# Patient Record
Sex: Male | Born: 1975 | Race: Black or African American | Hispanic: No | Marital: Single | Smoking: Current every day smoker
Health system: Southern US, Community
[De-identification: ages and names within clinical notes are randomized; demographics above are authoritative.]

## PROBLEM LIST (undated history)

## (undated) DIAGNOSIS — K219 Gastro-esophageal reflux disease without esophagitis: Secondary | ICD-10-CM

## (undated) HISTORY — PX: FRACTURE SURGERY: SHX138

---

## 2006-10-19 ENCOUNTER — Ambulatory Visit: Payer: Self-pay | Admitting: Internal Medicine

## 2008-01-26 IMAGING — CR RIGHT HAND - COMPLETE 3+ VIEW
1 series · 3 of 3 positions shown · non-contrast
Comparison: none

REASON FOR EXAM: hit hand
COMMENTS:

[Series 1: view not recorded · 0.17mm/px · 3 of 3 slices shown]
[im 1/3]
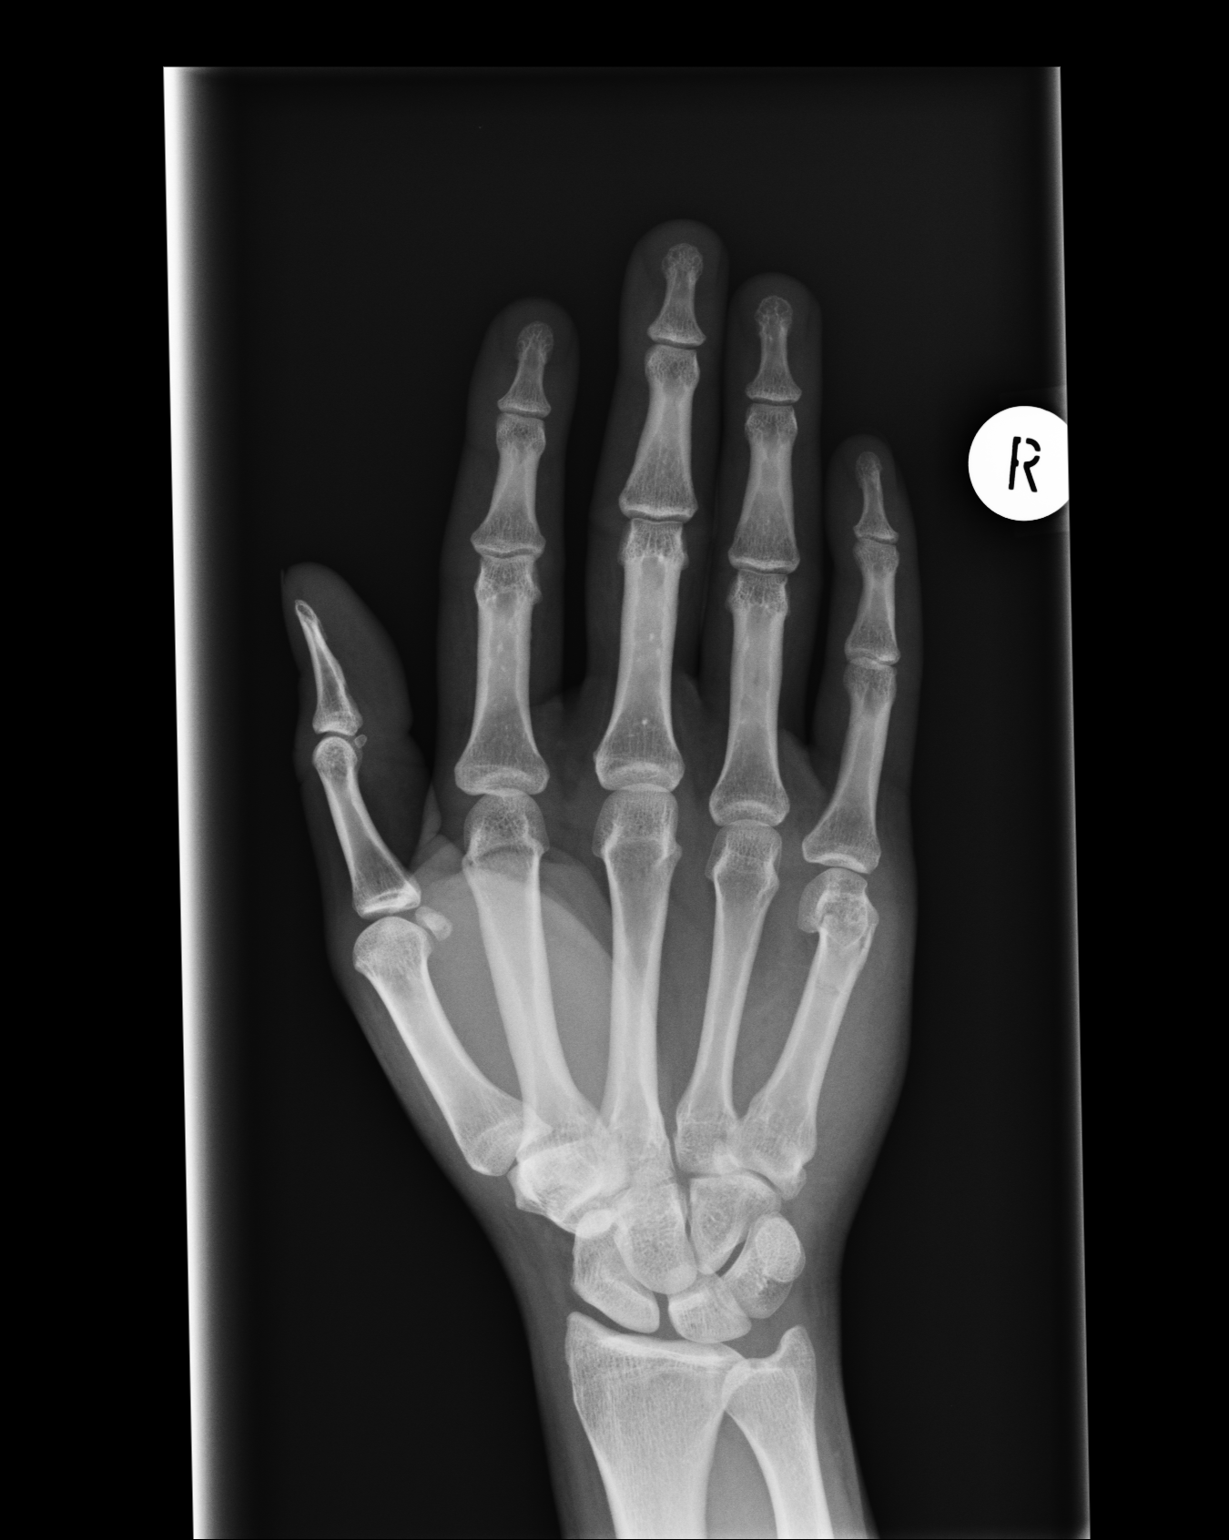
[im 2/3]
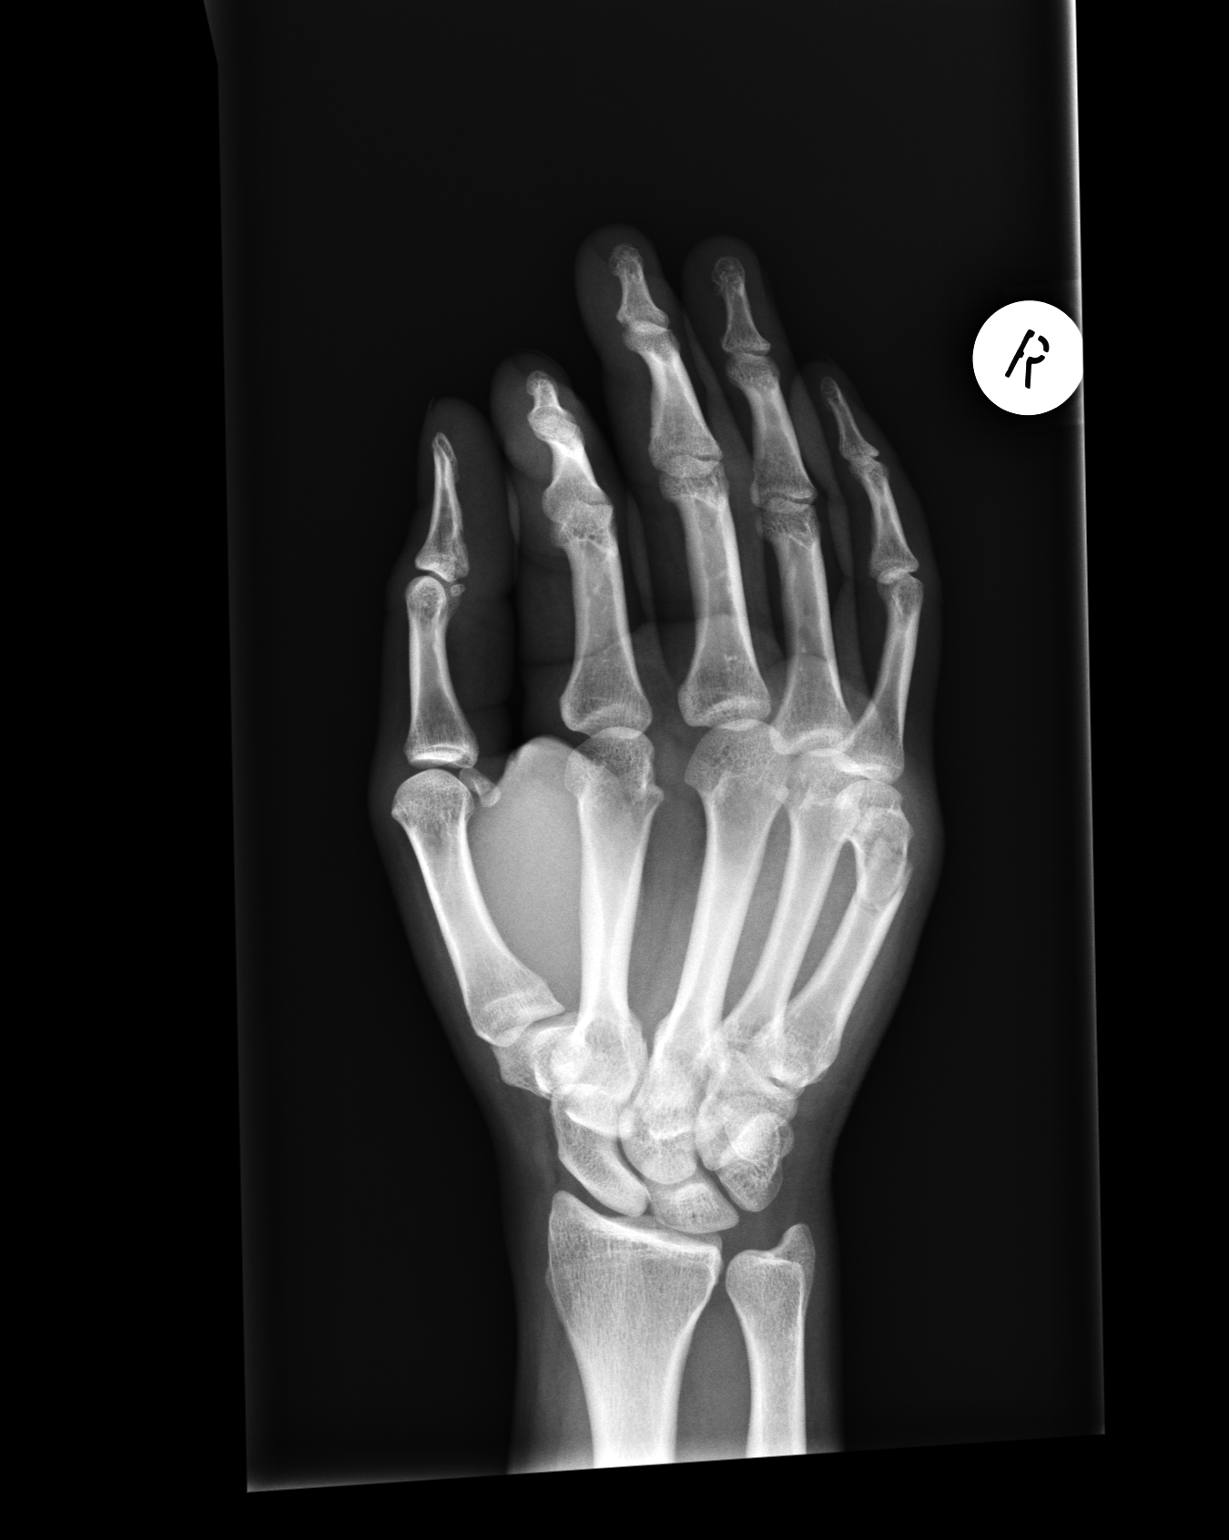
[im 3/3]
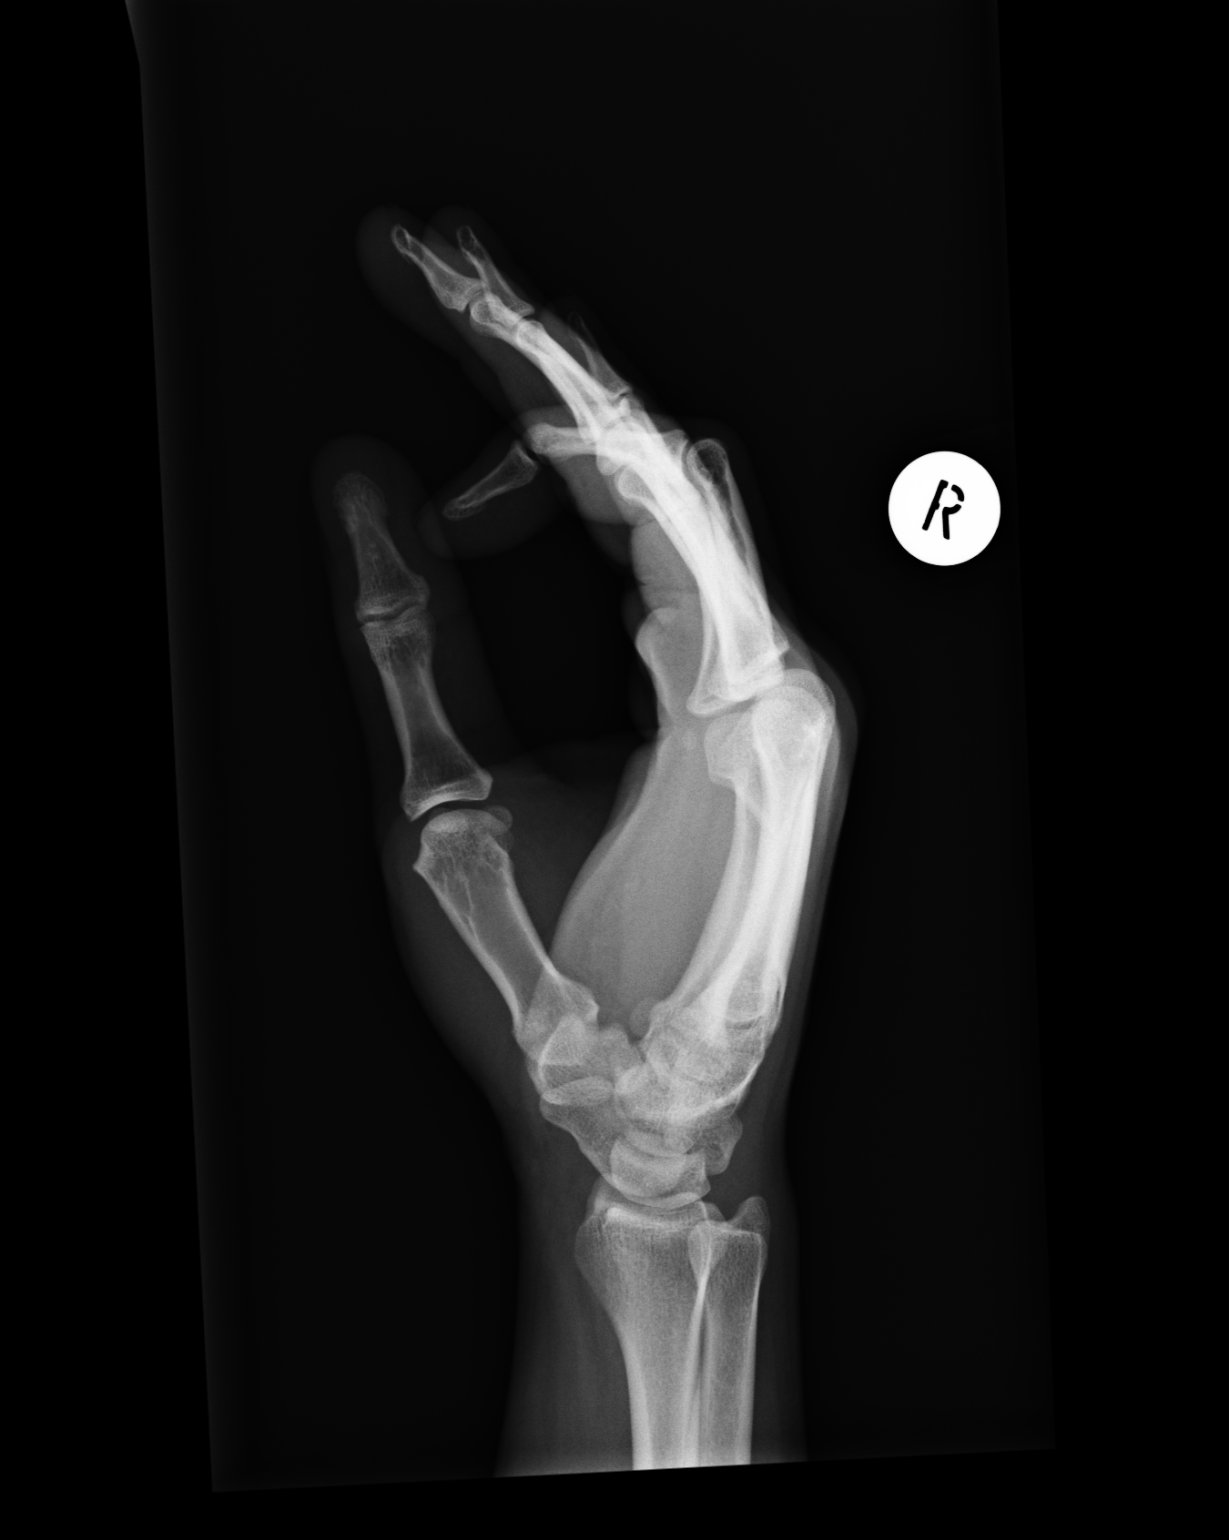

[3 of 3 positions shown; findings below may reference images not displayed]

PROCEDURE:     MDR - MDR HAND RT COMP W/OBLIQUES  - October 19, 2006  [DATE]

RESULT:     Images the right hand demonstrate a boxer's fracture in the
distal portion of the right fifth metacarpal with apex posterior angulation.
There does not appear to be a significant degree of distraction. No other
focal bony abnormality is evident.
IMPRESSION: Right fifth metacarpal fracture.

## 2018-10-25 ENCOUNTER — Other Ambulatory Visit: Payer: Self-pay

## 2018-10-25 ENCOUNTER — Ambulatory Visit (INDEPENDENT_AMBULATORY_CARE_PROVIDER_SITE_OTHER): Payer: Self-pay

## 2018-10-25 ENCOUNTER — Ambulatory Visit
Admission: EM | Admit: 2018-10-25 | Discharge: 2018-10-25 | Disposition: A | Discharge status: Home / Self Care | Payer: Self-pay

## 2018-10-25 ENCOUNTER — Encounter: Payer: Self-pay | Admitting: Emergency Medicine

## 2018-10-25 ENCOUNTER — Ambulatory Visit: Payer: Self-pay

## 2018-10-25 DIAGNOSIS — M79641 Pain in right hand: Secondary | ICD-10-CM

## 2018-10-25 DIAGNOSIS — S60221A Contusion of right hand, initial encounter: Secondary | ICD-10-CM

## 2018-10-25 DIAGNOSIS — Z8781 Personal history of (healed) traumatic fracture: Secondary | ICD-10-CM

## 2018-10-25 HISTORY — DX: Gastro-esophageal reflux disease without esophagitis: K21.9

## 2018-10-25 MED ORDER — KETOROLAC TROMETHAMINE 10 MG PO TABS: 10.0000 mg | ORAL_TABLET | Freq: Four times a day (QID) | ORAL | 0 refills | Status: AC | PRN

## 2018-10-25 NOTE — Discharge Instructions (Addendum)
It was very nice seeing you today in clinic. Thank you for entrusting me with your care.   Wear splint. REST, ICE, and ELEVATE hand to help with swelling. Please utilize the medications that we discussed. Your prescriptions have been called in to your pharmacy.   Make arrangements to follow up with orthopedic doctor. I have provided you the name and office contact information for an excellent local provider. If your symptoms/condition worsens, please seek follow up care either here or in the ER. Please remember, our [place] providers are "right here with you" when you need [place].   Again, it was my pleasure to take care of you today. Thank you for choosing our clinic. I hope that you start to feel better quickly.   Honor Loh, MSN, APRN, FNP-C, CEN Advanced [facility] Provider [facility]

## 2018-10-25 NOTE — ED Triage Notes (Signed)
Pt c/o right hand pain and swelling. He states that he slammed his hand down on a table about 2 days ago. He has full ROM on all fingers except his pinky finger.

## 2018-10-25 NOTE — ED Provider Notes (Signed)
[place], [place]   Name: Jason Hoover DOB: 03/18/1975 MRN: [id number] CSN: [id number] PCP: Patient, No Pcp Per  Arrival date and time:  10/25/18 1042  Chief Complaint:  Hand Pain (right)   NOTE: Prior to seeing the patient today, I have reviewed the triage nursing documentation and vital signs. Clinical staff has updated patient's PMH/PSHx, current medication list, and drug allergies/intolerances to ensure comprehensive history available to assist in medical decision making.   History:   HPI: Jason Hoover is a 43 y.o. male who presents today with complaints of pain and swelling to his RIGHT hand. Patient advising that he slammed his hand down on the table on Sunday, which resulted in his current symptoms. Patient is unable to make a fist due to the acute pain and swelling. He reports a history of fracture in the same hand. Patient states, "it popped out of place a while ago, but I never went back and and it put back in place because it hurt when they did it the first time". Despite his symptoms, patient has not taken any over the counter interventions to help improve/relieve his reported symptoms at home.   Past Medical History:  Diagnosis Date   GERD (gastroesophageal reflux disease)     Past Surgical History:  Procedure Laterality Date   FRACTURE SURGERY Left    foot    Family History  Problem Relation Age of Onset   Healthy Mother    Cirrhosis Father     Social History   Tobacco Use   Smoking status: Current Every Day Smoker    Packs/day: 0.50    Types: Cigarettes   Smokeless tobacco: Never Used  Substance Use Topics   Alcohol use: Not Currently   Drug use: Not Currently    There are no active problems to display for this patient.   Home Medications:    Current Meds  Medication Sig   esomeprazole (NEXIUM) 40 MG capsule Take 40 mg by mouth daily at 12 noon.    Allergies:   Patient has no known allergies.  Review of Systems (ROS): Review of  Systems  Constitutional: Negative for chills and fever.  Respiratory: Negative for cough and shortness of breath.   Cardiovascular: Negative for chest pain and palpitations.  Musculoskeletal:       Acute pain and swelling of RIGHT hand  All other systems reviewed and are negative.    Vital Signs: Today's Vitals   10/25/18 1050 10/25/18 1053  BP:  (!) 150/109  Pulse:  91  Resp:  18  Temp:  98.1 F (36.7 C)  TempSrc:  Oral  SpO2:  98%  Weight: 185 lb (83.9 kg)   Height: 5\' 9"  (1.753 m)   PainSc: 5      Physical Exam: Physical Exam  Constitutional: He is oriented to person, place, and time and well-developed, well-nourished, and in no distress.  HENT:  Head: Normocephalic and atraumatic.  Mouth/Throat: Mucous membranes are normal.  Eyes: Pupils are equal, round, and reactive to light. EOM are normal.  Neck: Neck supple.  Cardiovascular: Normal rate, regular rhythm, normal heart sounds and intact distal pulses. Exam reveals no gallop and no friction rub.  No murmur heard. Pulmonary/Chest: Effort normal and breath sounds normal. No respiratory distress. He has no wheezes. He has no rales.  Musculoskeletal:     Right hand: He exhibits decreased range of motion, tenderness (overlying 5th metacarpal) and swelling. He exhibits normal two-point discrimination, normal capillary refill and no deformity. Normal sensation  noted.  Neurological: He is alert and oriented to person, place, and time. Gait normal.  Skin: Skin is warm and dry. No rash noted.  Psychiatric: Mood, memory, affect and judgment normal.  Nursing note and vitals reviewed.   Urgent Care Treatments / Results:   LABS: PLEASE NOTE: all labs that were ordered this encounter are listed, however only abnormal results are displayed. Labs Reviewed - No data to display  EKG: -None  RADIOLOGY: Dg Hand Complete Right  Result Date: 10/25/2018 CLINICAL DATA:  Right hand pain and swelling after injury. EXAM: RIGHT HAND -  COMPLETE 3+ VIEW COMPARISON:  Radiographs of October 19, 2006. FINDINGS: Deformity of distal fifth metacarpal is noted consistent with old healed fracture. No acute fracture or dislocation is noted. Joint spaces are intact. Dorsal soft tissue swelling is noted which may be due to injury. IMPRESSION: Old distal fifth metacarpal fracture is noted. No acute abnormality is noted. Electronically Signed   By: Lupita Raider M.D.   On: 10/25/2018 11:11    PROCEDURES: Procedures  MEDICATIONS RECEIVED THIS VISIT: Medications - No data to display  PERTINENT CLINICAL COURSE NOTES/UPDATES:   Initial Impression / Assessment and Plan / Urgent Care Course:  Pertinent labs & imaging results that were available during my care of the patient were personally reviewed by me and considered in my medical decision making (see lab/imaging section of note for values and interpretations).  Bader Lello is a 43 y.o. male who presents to [facility] today with complaints of Hand Pain (right)   Patient is well appearing overall in clinic today. He does not appear to be in any acute distress. Presenting symptoms (see HPI) and exam as documented above. Diagnostic radiograph of the RIGHT hand reveals no acute abnormality. There is (+) dorsal soft tissue swelling observed. Mention made of an old distal 5th metacarpal fracture. Discussed with attending on duty Adriana Simas, DO). Recommendations were for immobilization in an ulnar gutter OCL and referral to orthopedics. Splint applied by clinic nursing staff. Encouraged patient to wear splint until evaluated by orthopedics. Patient to rest, ice, and elevate hand to help with pain and swelling. Pain is minimal until hand is being used. Will provide prescription for NSAID (ketorolac) to be used on a PRN basis for pain.   Patient needs to be seen for further evaluation by orthopedics. Name and office contact information provided on today's AVS for Dr. Kennedy Bucker. Patient advised  the he will need to contact the office to schedule an appointment to be seen. I have reviewed the follow up and strict return precautions for any new or worsening symptoms. Patient is aware of symptoms that would be deemed urgent/emergent, and would thus require further evaluation either here or in the emergency department. At the time of discharge, he verbalized understanding and consent with the discharge plan as it was reviewed with him. All questions were fielded by provider and/or clinic staff prior to patient discharge.    Final Clinical Impressions / Urgent Care Diagnoses:   Final diagnoses:  Right hand pain  Contusion of right hand, initial encounter  H/O hand fracture    New Prescriptions:  [place] Controlled Substance Registry consulted? Not Applicable  Meds ordered this encounter  Medications   ketorolac (TORADOL) 10 MG tablet    Sig: Take 1 tablet (10 mg total) by mouth every 6 (six) hours as needed.    Dispense:  20 tablet    Refill:  0    Recommended Follow up  Care:  Patient encouraged to follow up with the following provider within the specified time frame, or sooner as dictated by the severity of his symptoms. As always, he was instructed that for any urgent/emergent care needs, he should seek care either here or in the emergency department for more immediate evaluation.  Follow-up Information    Call  Hessie Knows, MD.   Specialty: Orthopedic Surgery Why: Will need to be seen in follow up Contact information: [place] [id number] [phone]         NOTE: This note was prepared using Dragon dictation software along with smaller phrase technology. Despite my best ability to proofread, there is the potential that transcriptional errors may still occur from this process, and are completely unintentional.    Karen Kitchens, NP 10/25/18 1252

## 2020-02-01 IMAGING — CR DG HAND COMPLETE 3+V*R*
3 series · 3 of 3 positions shown · non-contrast
Comparison: Radiographs October 19, 2006.

CLINICAL DATA: Right hand pain and swelling after injury.

EXAM:
RIGHT HAND - COMPLETE 3+ VIEW

[hand ap]
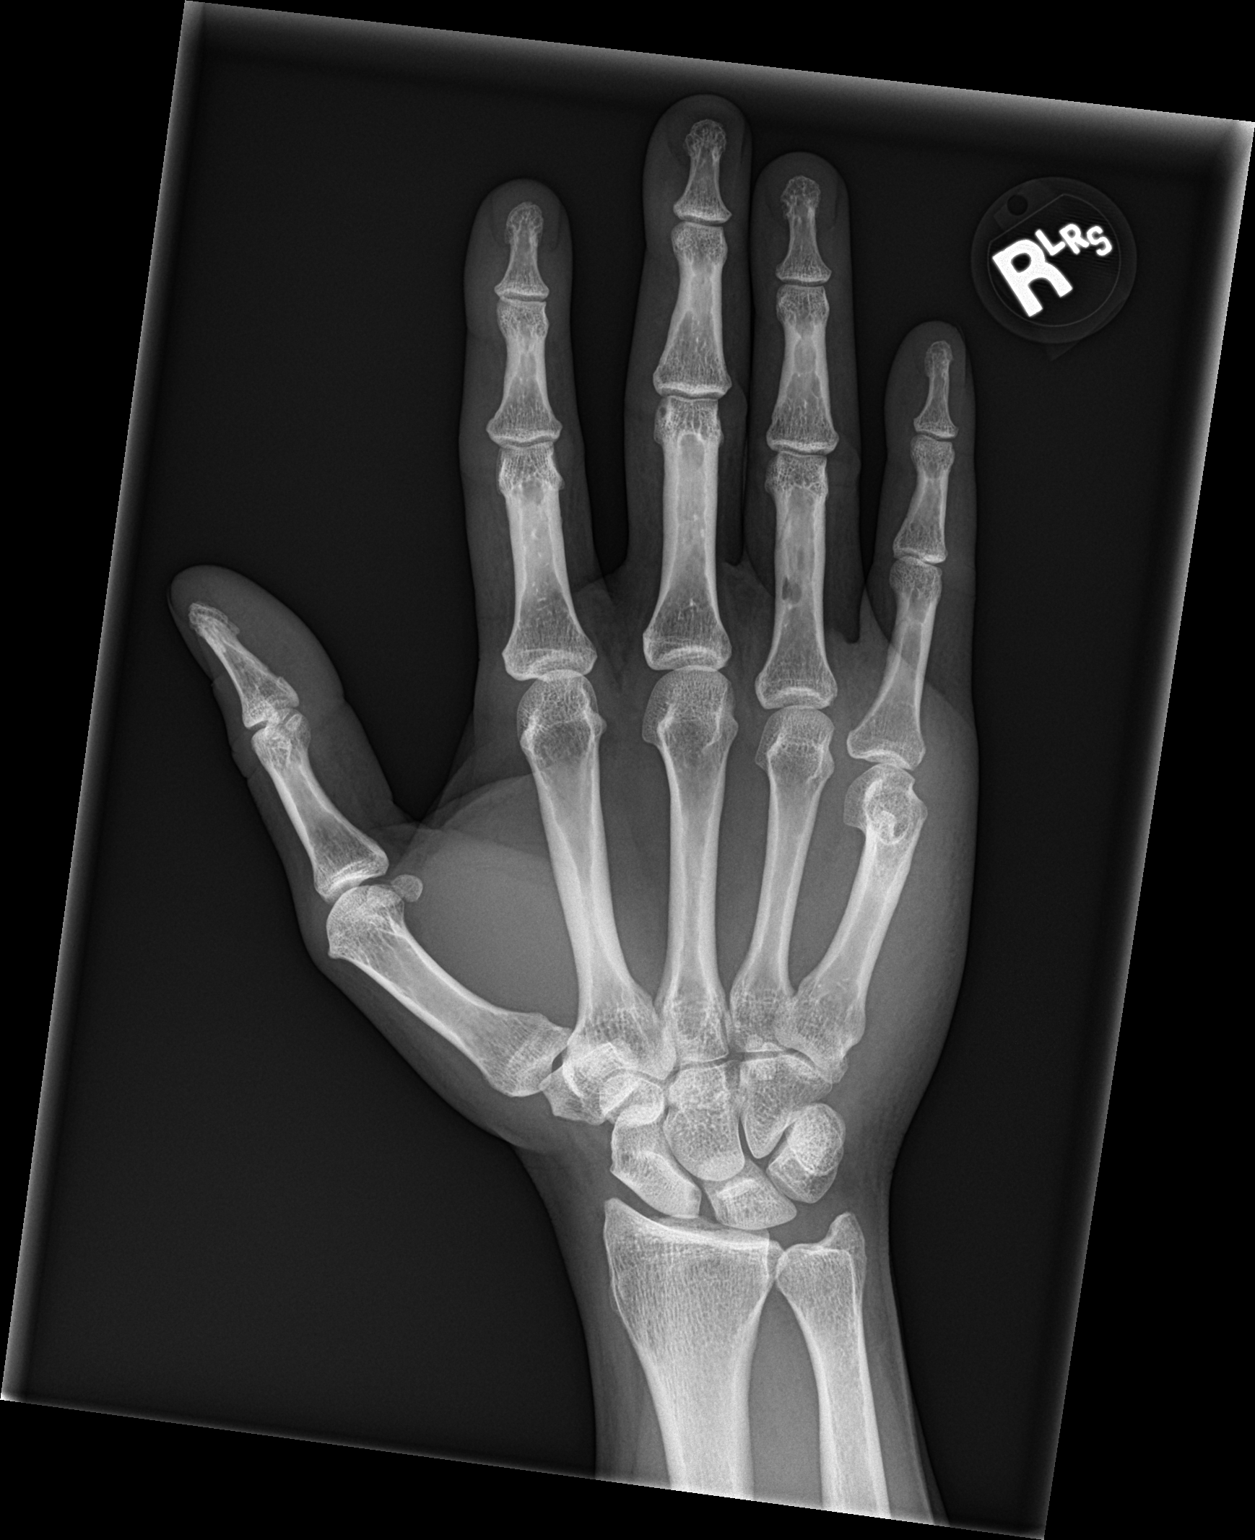

[hand obl]
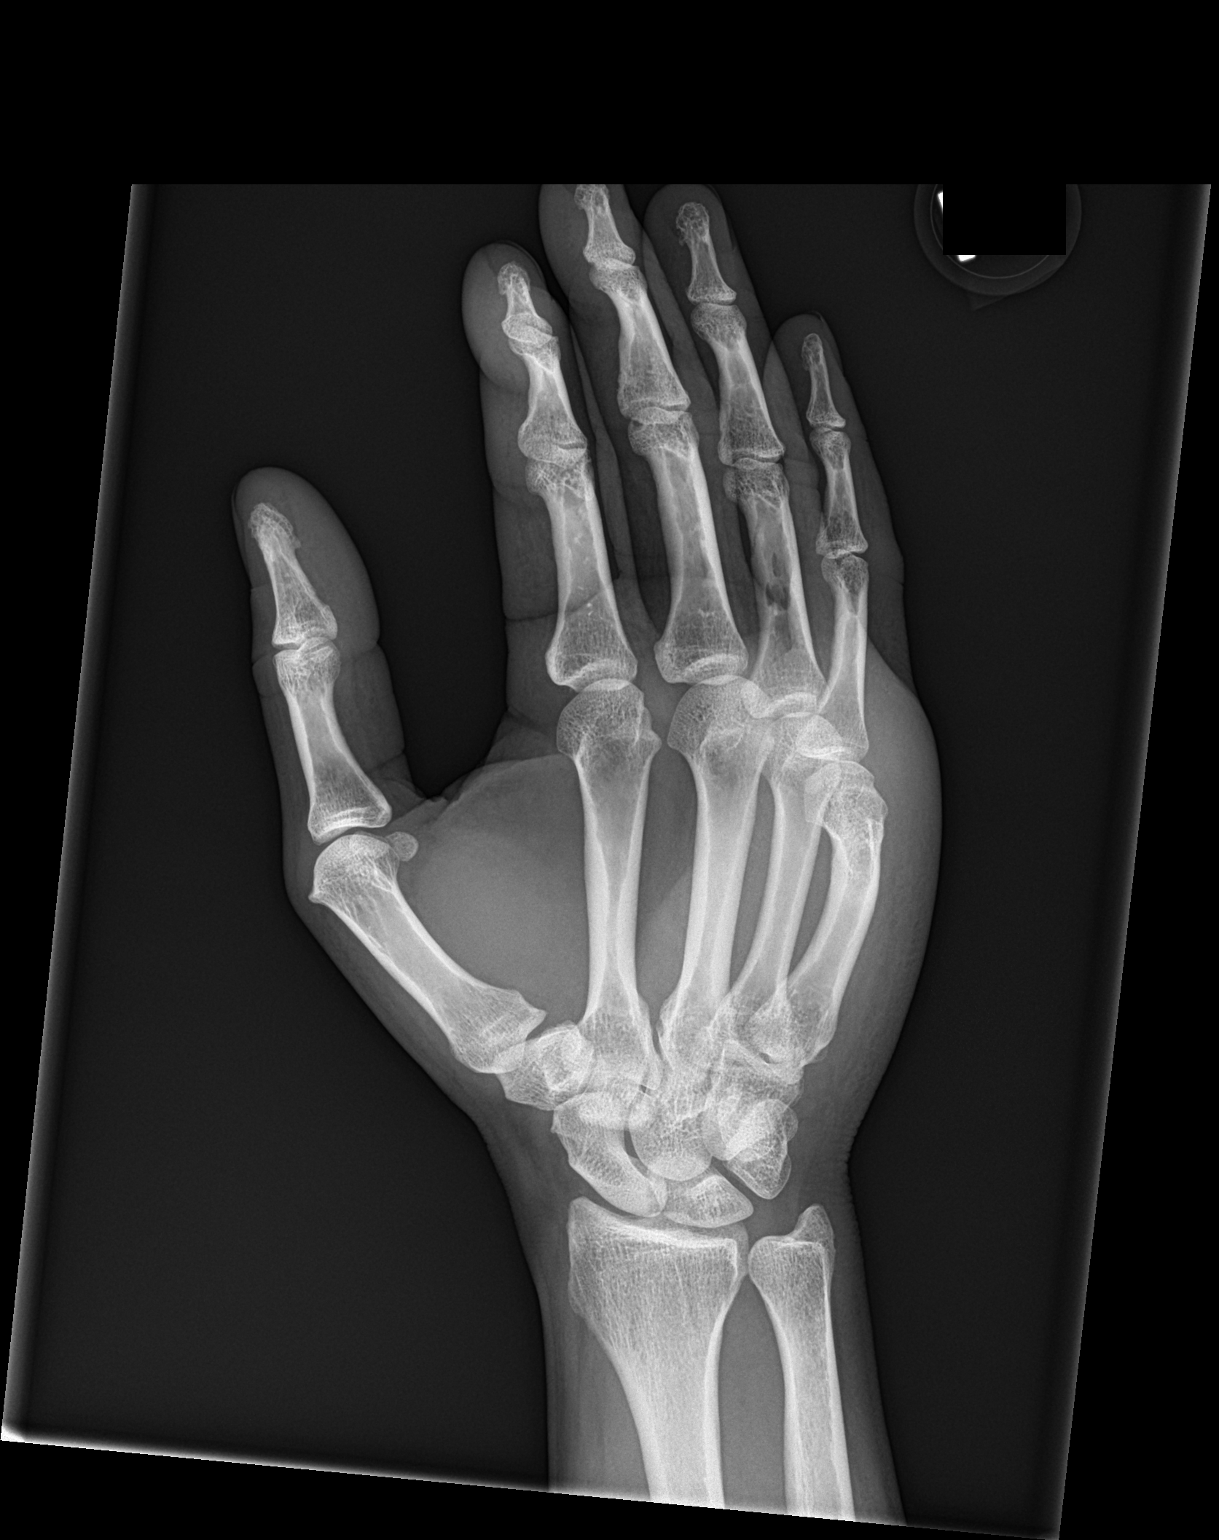

[hand lat]
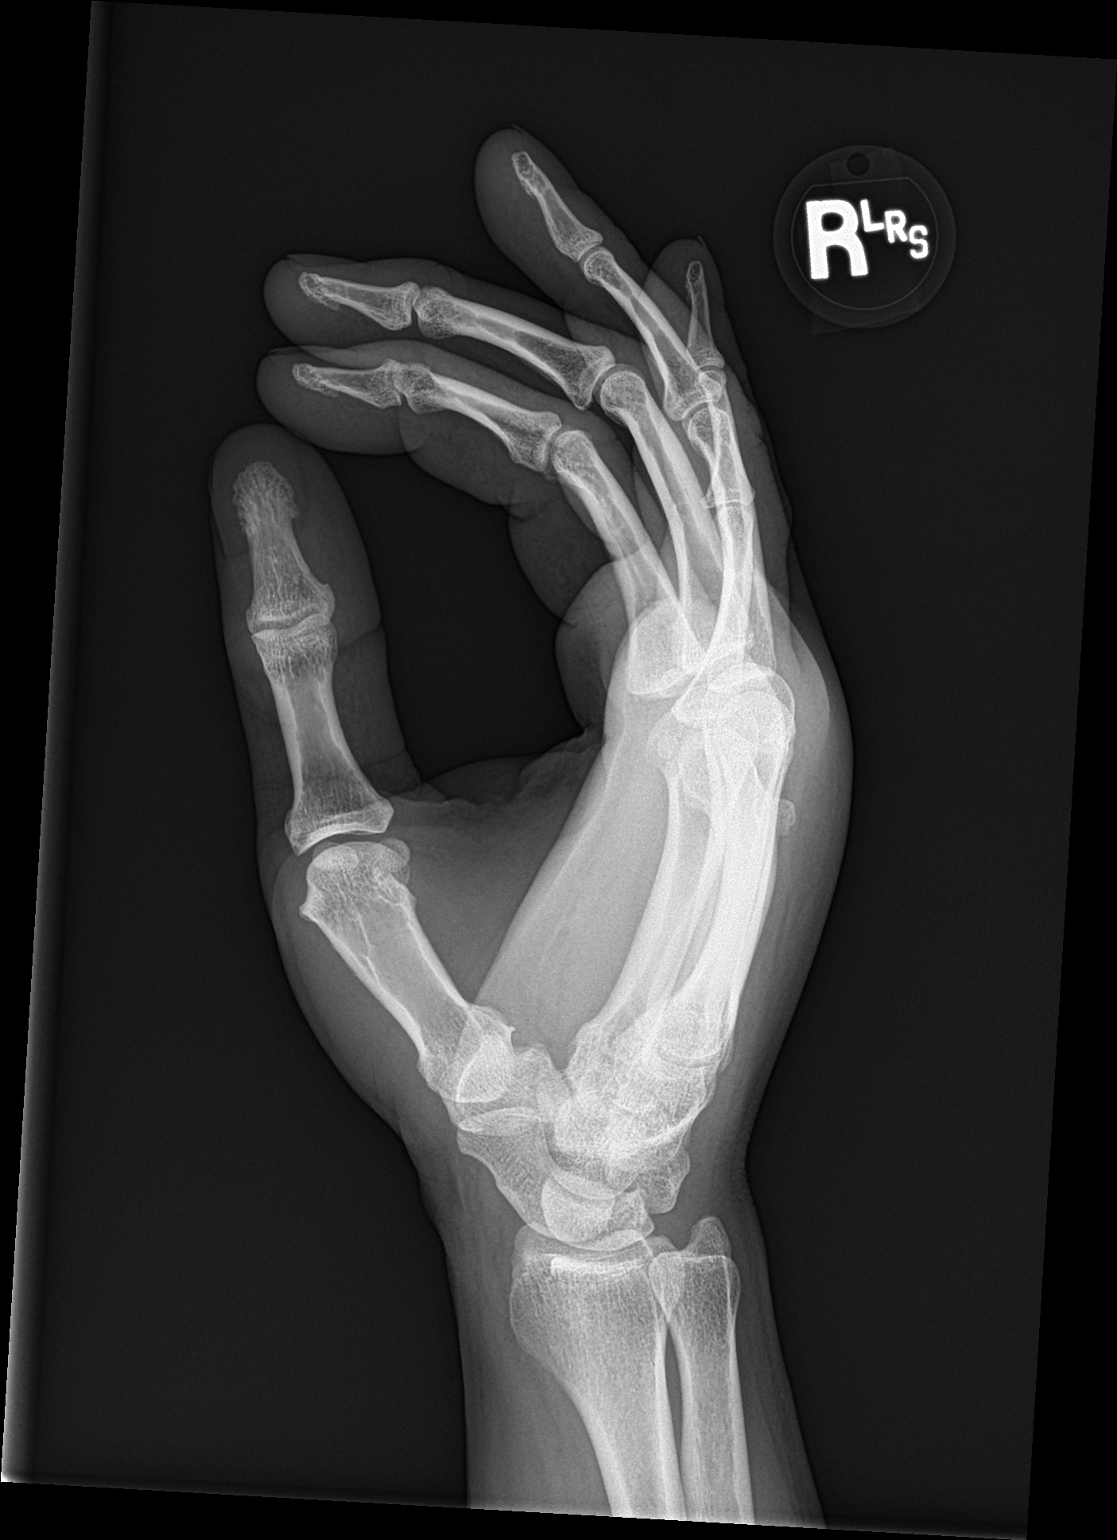

[3 of 3 positions shown; findings below may reference images not displayed]

FINDINGS: Deformity of distal fifth metacarpal is noted consistent with old
healed fracture. No acute fracture or dislocation is noted. Joint
spaces are intact. Dorsal soft tissue swelling is noted which may be
due to injury.
IMPRESSION: Old distal fifth metacarpal fracture is noted. No acute abnormality
is noted.
